# Patient Record
Sex: Male | Born: 1990 | Race: Black or African American | Hispanic: No
Health system: Southern US, Community
[De-identification: ages and names within clinical notes are randomized; demographics above are authoritative.]

---

## 2021-01-21 ENCOUNTER — Emergency Department: Payer: 59

## 2021-01-21 ENCOUNTER — Emergency Department
Admission: EM | Admit: 2021-01-21 | Discharge: 2021-01-22 | Disposition: A | Discharge status: Home / Self Care | Payer: 59 | Attending: Emergency Medicine | Admitting: Emergency Medicine

## 2021-01-21 ENCOUNTER — Other Ambulatory Visit: Payer: Self-pay

## 2021-01-21 ENCOUNTER — Encounter: Payer: Self-pay | Admitting: Emergency Medicine

## 2021-01-21 DIAGNOSIS — N50819 Testicular pain, unspecified: Secondary | ICD-10-CM

## 2021-01-21 DIAGNOSIS — N50811 Right testicular pain: Secondary | ICD-10-CM | POA: Diagnosis not present

## 2021-01-21 LAB — URINALYSIS, COMPLETE (UACMP) WITH MICROSCOPIC
Bacteria, UA: NONE SEEN
Bilirubin Urine: NEGATIVE
Glucose, UA: NEGATIVE mg/dL
Hgb urine dipstick: NEGATIVE
Ketones, ur: 20 mg/dL — AB
Nitrite: NEGATIVE
Protein, ur: NEGATIVE mg/dL
Specific Gravity, Urine: 1.027 (ref 1.005–1.030)
pH: 5 (ref 5.0–8.0)

## 2021-01-21 MED ORDER — ACETAMINOPHEN 325 MG PO TABS
650.0000 mg | ORAL_TABLET | Freq: Once | ORAL | Status: AC
  Administered 2021-01-21: 650 mg via ORAL
  Filled 2021-01-21: qty 2

## 2021-01-21 MED ORDER — IBUPROFEN 600 MG PO TABS
600.0000 mg | ORAL_TABLET | Freq: Once | ORAL | Status: AC
  Administered 2021-01-21: 600 mg via ORAL
  Filled 2021-01-21: qty 1

## 2021-01-21 NOTE — ED Provider Notes (Signed)
[facility] Emergency Department Provider Note  ____________________________________________  Time seen: Approximately 11:18 PM  I have reviewed the triage vital signs and the nursing notes.   HISTORY  Chief Complaint Testicle Pain    HPI Jeffery Snyder is a 30 y.o. male who presents the emergency department complaining of right-sided testicular pain.  Patient states that he has had a long history of urinary issues.  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  Since then he has had ongoing issues with difficulty urinating, weak stream, pain.  Patient has been worked up multiple times with no acute findings.  He saw a urology team at [facility] 2 months ago and they started him on  alfuzosin.  Patient states that this has not improved symptoms.  Tonight developed right-sided testicular pain.  He has not had no penile discharge, dysuria, hematuria.  Patient states that initially the pain was quite severe but this is improved.  No abdominal pain.  No flank pain.        History reviewed. No pertinent past medical history.  There are no problems to display for this patient.   History reviewed. No pertinent surgical history.  Prior to Admission medications   Medication Sig Start Date End Date Taking? Authorizing Provider  doxycycline (VIBRA-TABS) 100 MG tablet Take 1 tablet (100 mg total) by mouth 2 (two) times daily. 01/22/21  Yes Norma Montemurro, Delorise Royals, PA-C    Allergies Patient has no known allergies.  No family history on file.  Social History     Review of Systems  Constitutional: No fever/chills Eyes: No visual changes. No discharge ENT: No upper respiratory complaints. Cardiovascular: no chest pain. Respiratory: no cough. No SOB. Gastrointestinal: No abdominal pain.  No nausea, no vomiting.  No diarrhea.  No constipation. Genitourinary: Negative for dysuria. No hematuria.  Positive for right testicular  pain Musculoskeletal: Negative for musculoskeletal pain. Skin: Negative for rash, abrasions, lacerations, ecchymosis. Neurological: Negative for headaches, focal weakness or numbness.  10 System ROS otherwise negative.  ____________________________________________   PHYSICAL EXAM:  VITAL SIGNS: ED Triage Vitals  Enc Vitals Group     BP 01/21/21 2040 133/83     Pulse Rate 01/21/21 2040 85     Resp 01/21/21 2040 18     Temp 01/21/21 2040 99.7 F (37.6 C)     Temp Source 01/21/21 2040 Oral     SpO2 01/21/21 2040 97 %     Weight 01/21/21 2040 170 lb (77.1 kg)     Height 01/21/21 2040 6' (1.829 m)     Head Circumference --      Peak Flow --      Pain Score 01/21/21 2054 10     Pain Loc --      Pain Edu? --      Excl. in GC? --      Constitutional: Alert and oriented. Well appearing and in no acute distress. Eyes: Conjunctivae are normal. PERRL. EOMI. Head: Atraumatic. ENT:      Ears:       Nose: No congestion/rhinnorhea.      Mouth/Throat: Mucous membranes are moist.  Neck: No stridor.    Cardiovascular: Normal rate, regular rhythm. Normal S1 and S2.  Good peripheral circulation. Respiratory: Normal respiratory effort without tachypnea or retractions. Lungs CTAB. Good air entry to the bases with no decreased or absent breath sounds. Gastrointestinal: Bowel sounds 4 quadrants. Soft and nontender to palpation. No guarding or rigidity.  No palpable masses. No distention. No CVA tenderness. Musculoskeletal: Full range of motion to all extremities. No gross deformities appreciated. Neurologic:  Normal speech and language. No gross focal neurologic deficits are appreciated.  Skin:  Skin is warm, dry and intact. No rash noted. Psychiatric: Mood and affect are normal. Speech and behavior are normal. Patient exhibits appropriate insight and judgement.   ____________________________________________   LABS (all labs ordered are listed, but only abnormal results are  displayed)  Labs Reviewed  URINALYSIS, COMPLETE (UACMP) WITH MICROSCOPIC - Abnormal; Notable for the following components:      Result Value   Color, Urine YELLOW (*)    APPearance HAZY (*)    Ketones, ur 20 (*)    Leukocytes,Ua TRACE (*)    All other components within normal limits  CHLAMYDIA/NGC RT PCR ([facility] ONLY)  URINE CULTURE   ____________________________________________  EKG   ____________________________________________  RADIOLOGY I personally viewed and evaluated these images as part of my medical decision making, as well as reviewing the written report by the radiologist.  ED Provider Interpretation: No abnormal findings to include varicocele, hydrocele, epididymitis or torsion  US SCROTUM W/DOPPLER  Result Date: 01/21/2021 CLINICAL DATA:  Testicular pain EXAM: SCROTAL ULTRASOUND DOPPLER ULTRASOUND OF THE TESTICLES TECHNIQUE: Complete ultrasound examination of the testicles, epididymis, and other scrotal structures was performed. Color and spectral Doppler ultrasound were also utilized to evaluate blood flow to the testicles. COMPARISON:  None. FINDINGS: Right testicle Measurements: 4.5 x 2.3 x 2.8 cm. No mass or microlithiasis visualized. Left testicle Measurements: 5.0 x 2.4 x 2.8 cm. No mass or microlithiasis visualized. Right epididymis:  Normal in size and appearance. Left epididymis:  Normal in size and appearance. Hydrocele:  None visualized. Varicocele:  None visualized. Pulsed Doppler interrogation of both testes demonstrates normal low resistance arterial and venous waveforms bilaterally. IMPRESSION: No testicular abnormality or evidence of torsion. Electronically Signed   By: Charlett Nose M.D.   On: 01/21/2021 21:40    ____________________________________________    PROCEDURES  Procedure(s) performed:    Procedures    Medications  cefTRIAXone (ROCEPHIN) injection 1 g (has no administration in time range)  acetaminophen (TYLENOL) tablet 650 mg  (650 mg Oral Given 01/21/21 2344)  ibuprofen (ADVIL) tablet 600 mg (600 mg Oral Given 01/21/21 2344)     ____________________________________________   INITIAL IMPRESSION / ASSESSMENT AND PLAN / ED COURSE  Pertinent labs & imaging results that were available during my care of the patient were reviewed by me and considered in my medical decision making (see chart for details).  Review of the [place] CSRS was performed in accordance of the [facility] prior to dispensing any controlled drugs.           Patient's diagnosis is consistent with testicular pain.  Patient presented to the emergency department complaining of right-sided testicular pain.  Began this evening.  Patient was febrile at 100.3 F though did not show any signs of toxicity.  He did not meet SIRS criteria.  Patient had reassuring ultrasound with no evidence of torsion though patient does have a history of torsion 18.  He has had ongoing urinary symptoms since this torsion.  He has been evaluated multiple times for urinary complaints and has never received a definitive diagnosis.  Patient is currently seeing urology at [facility] and I have recommended the patient follow-up with his urology team.  Patient has been on alfuzosin without symptomatic improvement.  Tonight he developed testicular pain.  No concerning physical exam findings, ultrasound  revealed no evidence of torsion, epididymitis, hydrocele or varicocele.  With the fever, leukocytes in the urine, testicular pain I will treat for possible source of infection.  Return precautions discussed with the patient and his wife who is present at this time.  Patient will have a gram of Rocephin here, doxycycline outpatient.  Follow-up with urology. Patient is given ED precautions to return to the ED for any worsening or new symptoms.     ____________________________________________  FINAL CLINICAL IMPRESSION(S) / ED DIAGNOSES  Final diagnoses:  Testicle pain      NEW MEDICATIONS STARTED  DURING THIS VISIT:  ED Discharge Orders         Ordered    doxycycline (VIBRA-TABS) 100 MG tablet  2 times daily        01/22/21 0018              This chart was dictated using voice recognition software/Dragon. Despite best efforts to proofread, errors can occur which can change the meaning. Any change was purely unintentional.    Racheal Patches, PA-C 01/22/21 0023    Delton Prairie, MD 01/22/21 [phone]

## 2021-01-21 NOTE — ED Triage Notes (Signed)
Pt reports that the right side of hie testicles are hurting. Denies any blood or discharge. Denies any injury. Began hurting an hour ago. Tearful in triage.

## 2021-01-21 NOTE — ED Notes (Signed)
Pt unable to provide urine sample at this time 

## 2021-01-22 LAB — CHLAMYDIA/NGC RT PCR (ARMC ONLY)
Chlamydia Tr: NOT DETECTED
N gonorrhoeae: NOT DETECTED

## 2021-01-22 MED ORDER — CEFTRIAXONE SODIUM 1 G IJ SOLR
1.0000 g | Freq: Once | INTRAMUSCULAR | Status: AC
  Administered 2021-01-22: 1 g via INTRAMUSCULAR
  Filled 2021-01-22: qty 10

## 2021-01-22 MED ORDER — DOXYCYCLINE HYCLATE 100 MG PO TABS: 100.0000 mg | ORAL_TABLET | Freq: Two times a day (BID) | ORAL | 0 refills | Status: AC

## 2021-01-23 LAB — URINE CULTURE: Culture: NO GROWTH

## 2021-12-06 DIAGNOSIS — M79644 Pain in right finger(s): Secondary | ICD-10-CM | POA: Diagnosis not present

## 2022-07-08 IMAGING — US US SCROTUM W/ DOPPLER COMPLETE
1 series · 14 of 25 positions shown · non-contrast
Comparison: None.

CLINICAL DATA: Testicular pain

EXAM:
SCROTAL ULTRASOUND
DOPPLER ULTRASOUND OF THE TESTICLES
TECHNIQUE: Complete ultrasound examination of the testicles, epididymis, and
other scrotal structures was performed. Color and spectral Doppler
ultrasound were also utilized to evaluate blood flow to the
testicles.

[Series 1: us scrotum w/doppler · 14 of 54 slices shown]
[im 1/54]
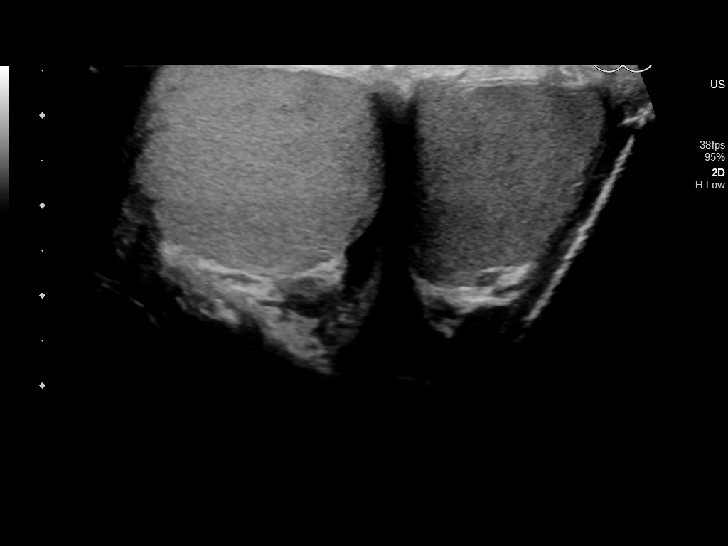
[im 5/54]
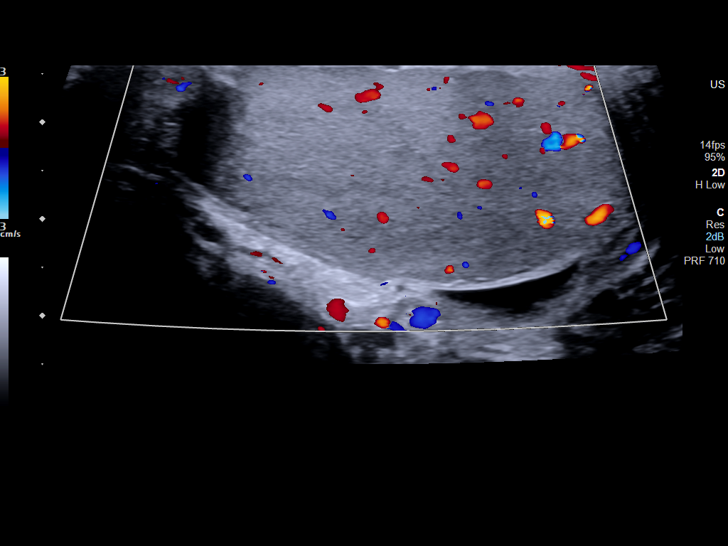
[im 9/54]
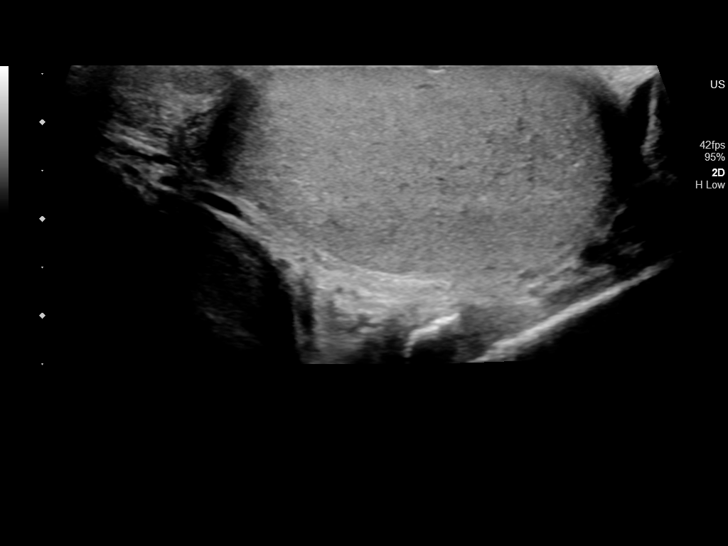
[im 14/54]
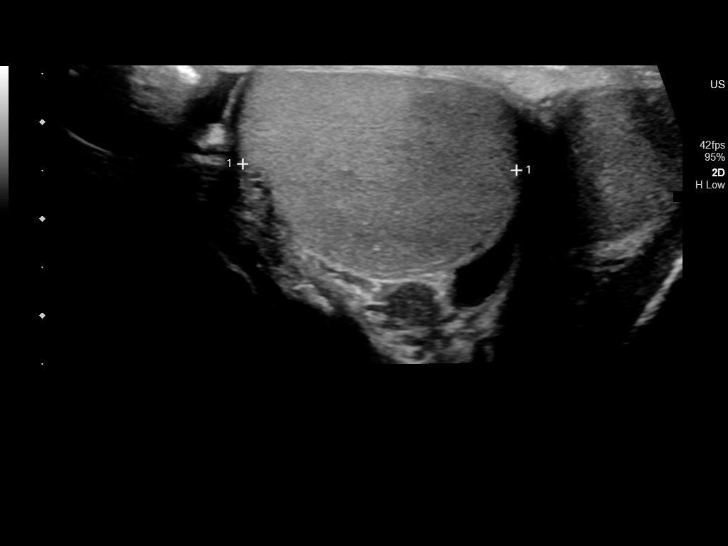
[im 18/54]
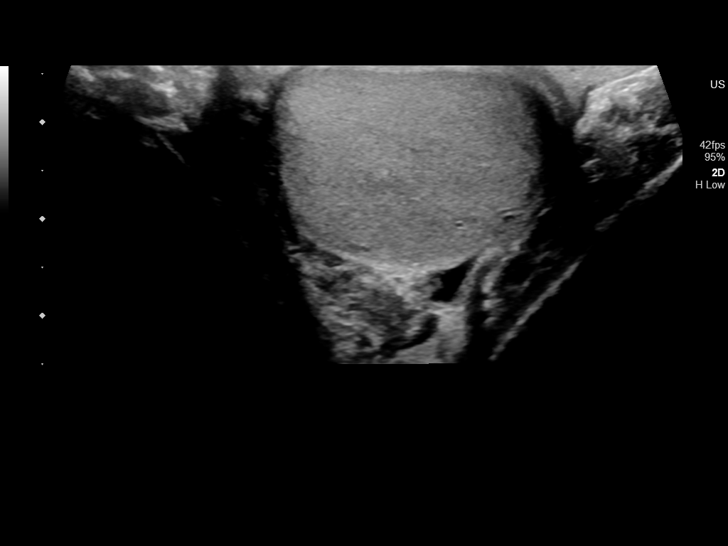
[im 20/54]
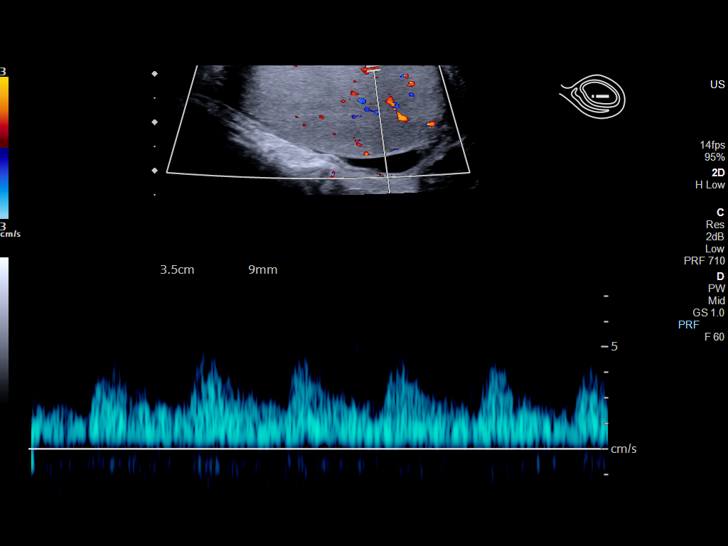
[im 25/54]
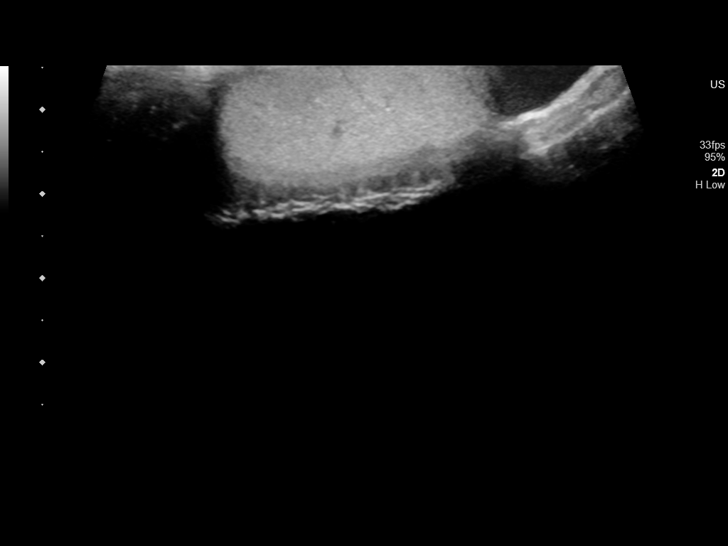
[im 29/54]
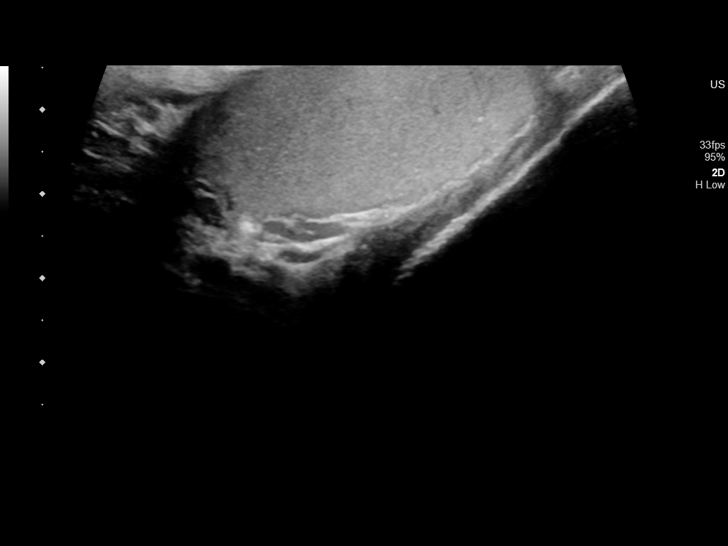
[im 34/54]
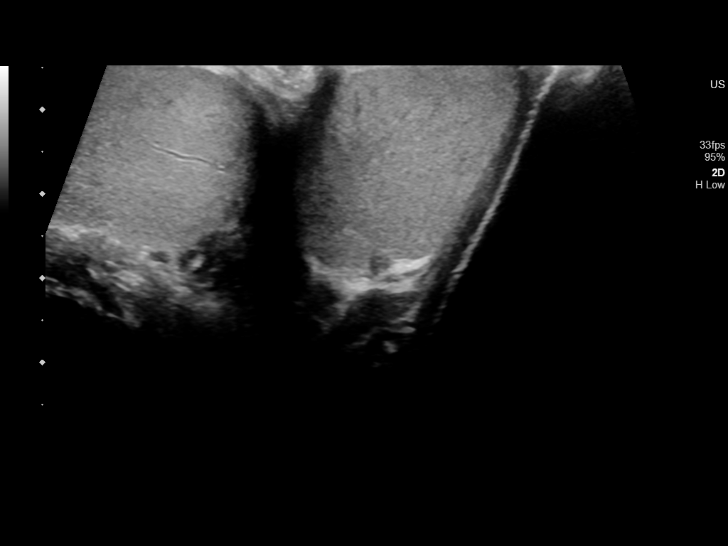
[im 36/54]
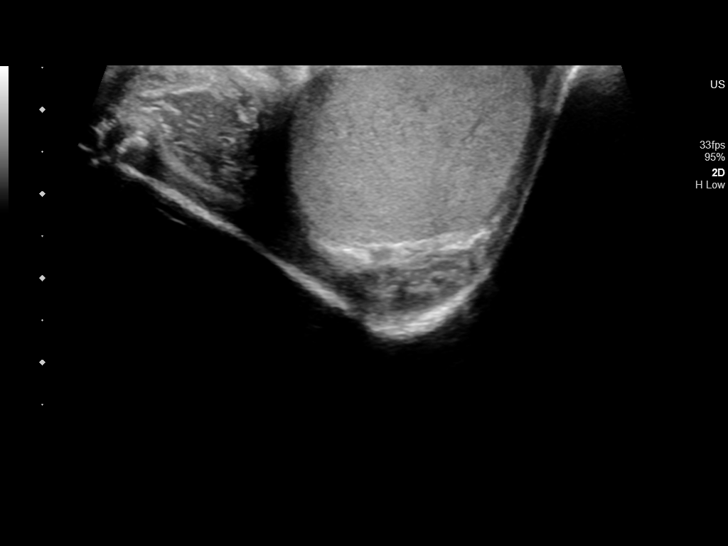
[im 40/54]
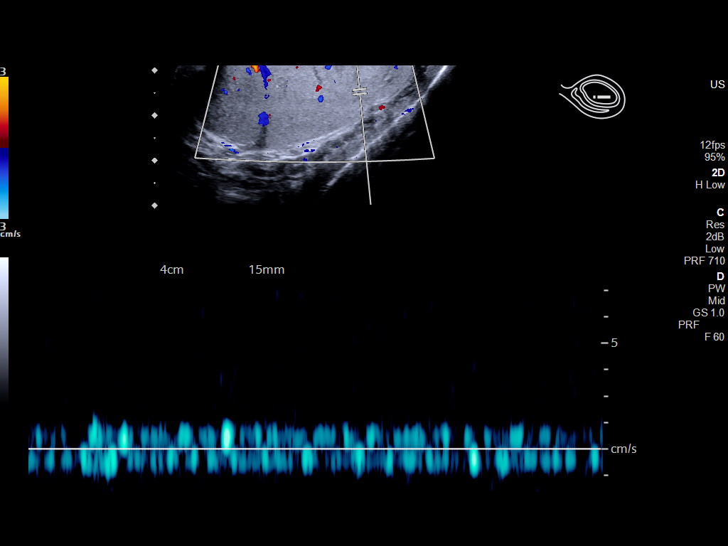
[im 45/54]
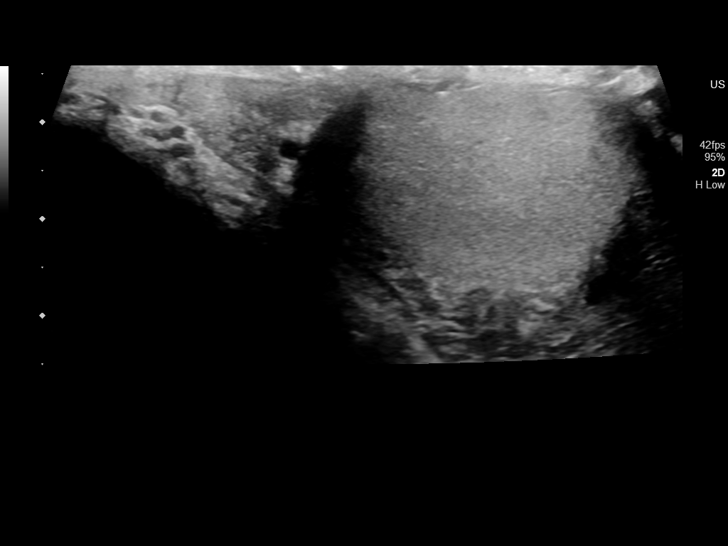
[im 49/54]
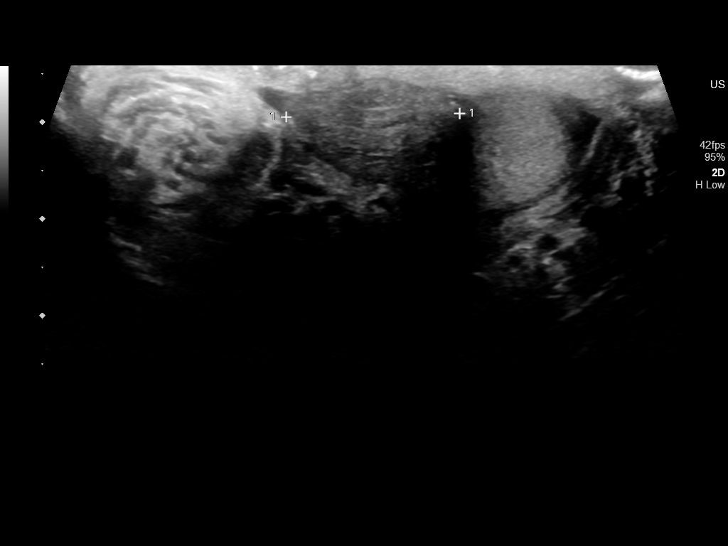
[im 54/54]
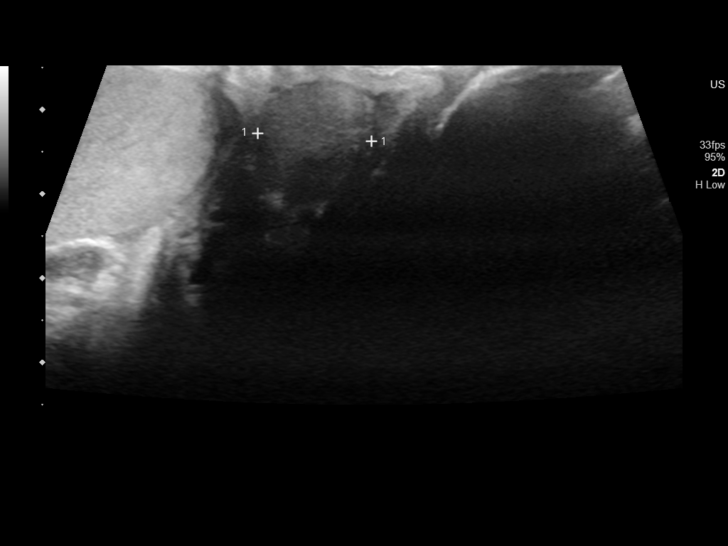

[14 of 25 positions shown; findings below may reference images not displayed]

FINDINGS: Right testicle

Measurements: 4.5 x 2.3 x 2.8 cm. No mass or microlithiasis
visualized.

Left testicle

Measurements: 5.0 x 2.4 x 2.8 cm. No mass or microlithiasis
visualized.

Right epididymis:  Normal in size and appearance.

Left epididymis:  Normal in size and appearance.

Hydrocele:  None visualized.

Varicocele:  None visualized.

Pulsed Doppler interrogation of both testes demonstrates normal low
resistance arterial and venous waveforms bilaterally.
IMPRESSION: No testicular abnormality or evidence of torsion.
